# Patient Record
Sex: Male | Born: 1960 | Race: Black or African American | Hispanic: No | Marital: Single
Health system: Southern US, Community
[De-identification: ages and names within clinical notes are randomized; demographics above are authoritative.]

---

## 2004-06-04 ENCOUNTER — Emergency Department: Payer: Self-pay | Admitting: Unknown Physician Specialty

## 2004-06-04 ENCOUNTER — Other Ambulatory Visit: Payer: Self-pay

## 2005-06-07 ENCOUNTER — Emergency Department: Payer: Self-pay | Admitting: Emergency Medicine

## 2005-11-22 ENCOUNTER — Inpatient Hospital Stay: Payer: Self-pay | Admitting: Internal Medicine

## 2005-11-22 ENCOUNTER — Other Ambulatory Visit: Payer: Self-pay

## 2005-11-24 ENCOUNTER — Other Ambulatory Visit: Payer: Self-pay

## 2008-10-04 ENCOUNTER — Emergency Department: Payer: Self-pay | Admitting: Internal Medicine

## 2008-10-27 ENCOUNTER — Ambulatory Visit: Payer: Self-pay

## 2011-04-16 IMAGING — CT CT ABD-PELV W/ CM
1 of 4 series · 14 of 32 positions shown, 19 images · non-contrast
Comparison: There is

REASON FOR EXAM: abd pain
COMMENTS:

PROCEDURE:     CT  - CT ABDOMEN / PELVIS  W  - October 27, 2008  [DATE]
RESULT:     History: Abdominal pain
TECHNIQUE: Multiple axial images of the abdomen and pelvis were performed
from the lung bases to the pubic symphysis, with p.o. contrast and with 100
mL of Esovue-ATE intravenous contrast.

[Series 2: abdomen · axial · 0.79mm/px · z∈[-1080,-660]mm · 14 of 96 slices shown, 19 images]
[im 6/96  soft-tissue]
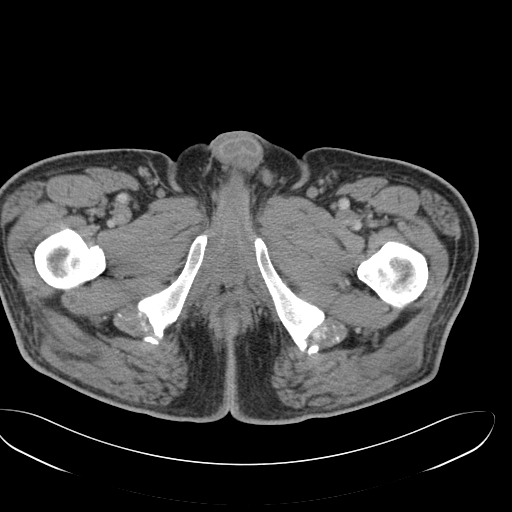
[im 6/96  bone]
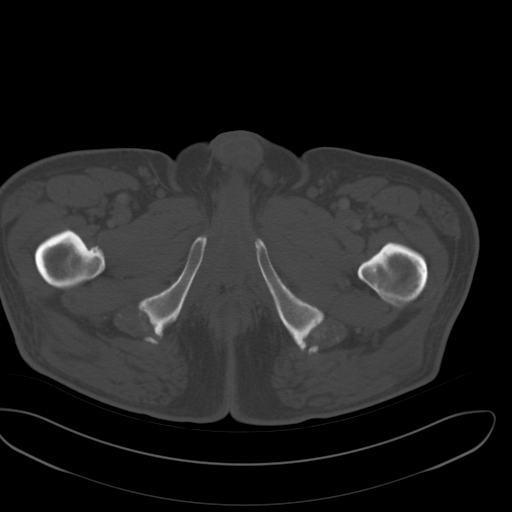
[im 12/96  soft-tissue]
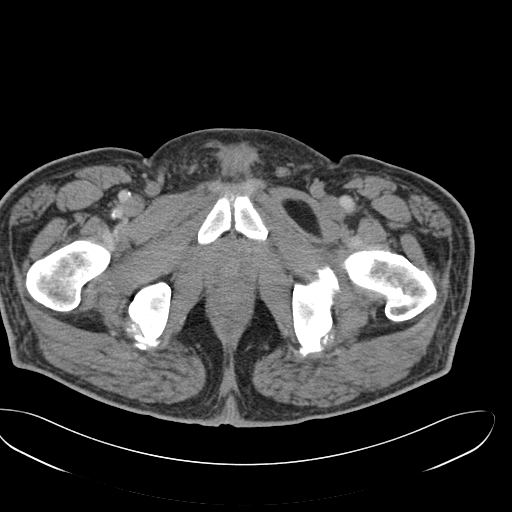
[im 23/96  soft-tissue]
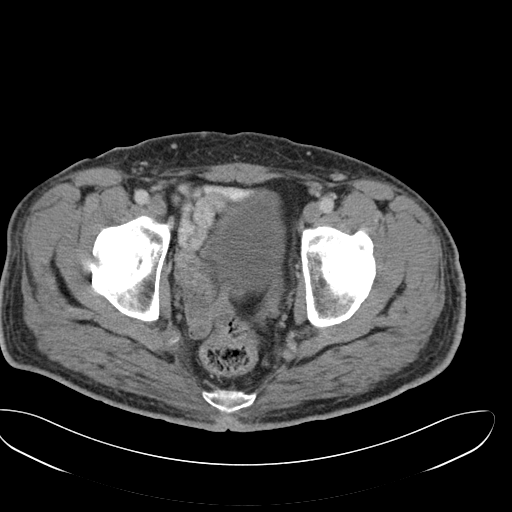
[im 28/96  soft-tissue]
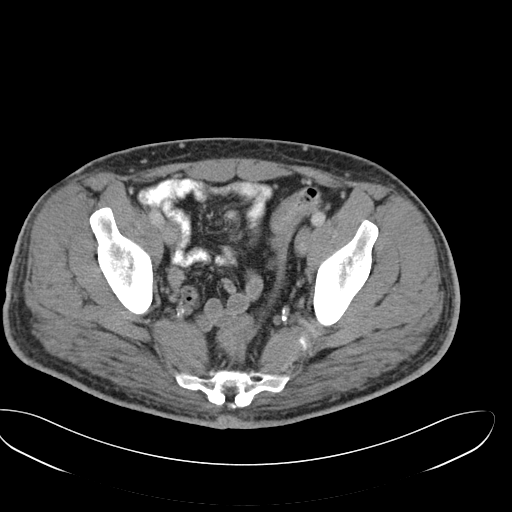
[im 34/96  soft-tissue]
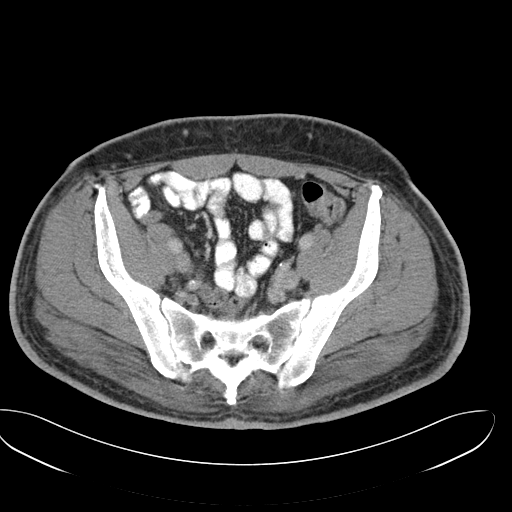
[im 40/96  soft-tissue]
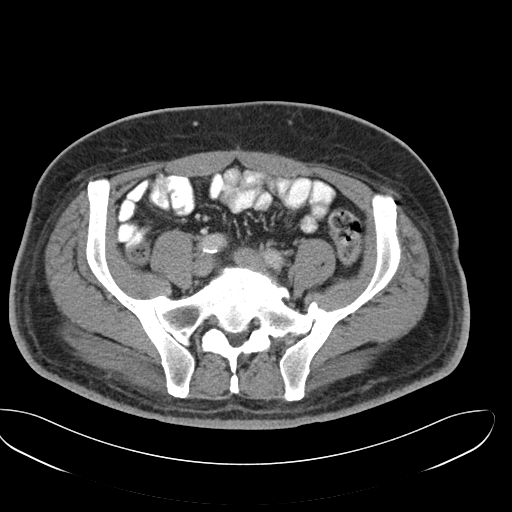
[im 51/96  soft-tissue]
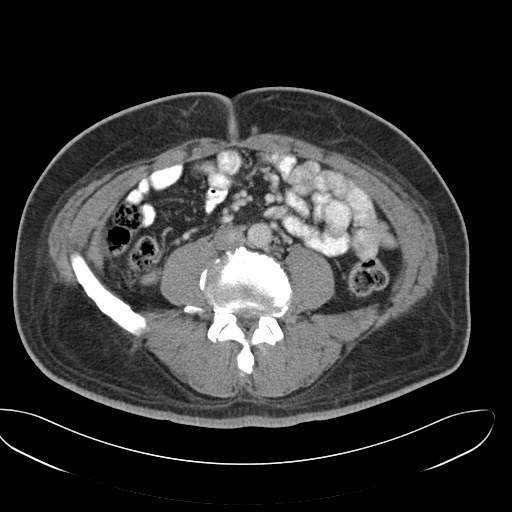
[im 56/96  soft-tissue]
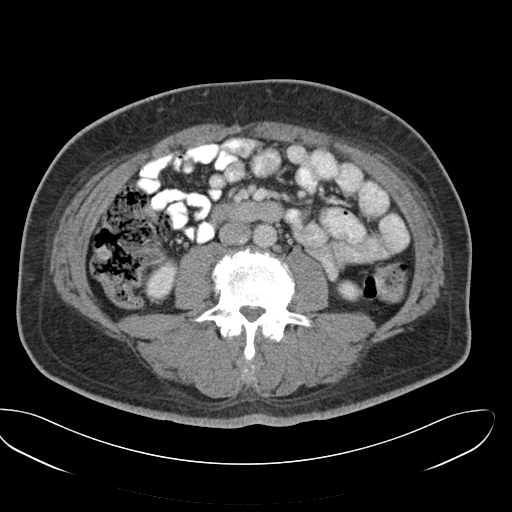
[im 62/96  soft-tissue]
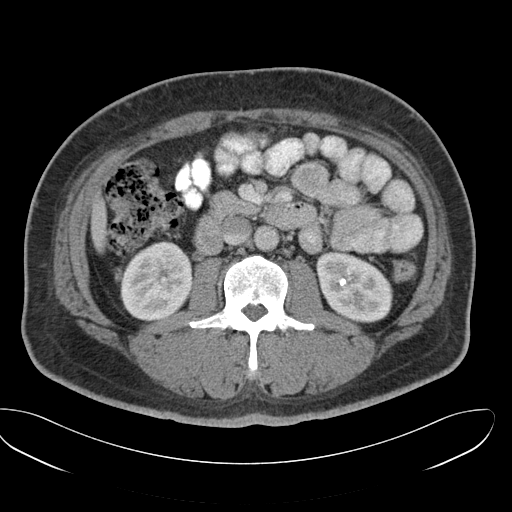
[im 62/96  bone]
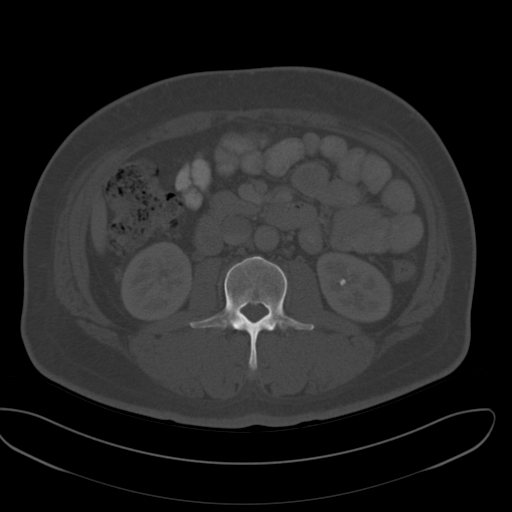
[im 68/96  soft-tissue]
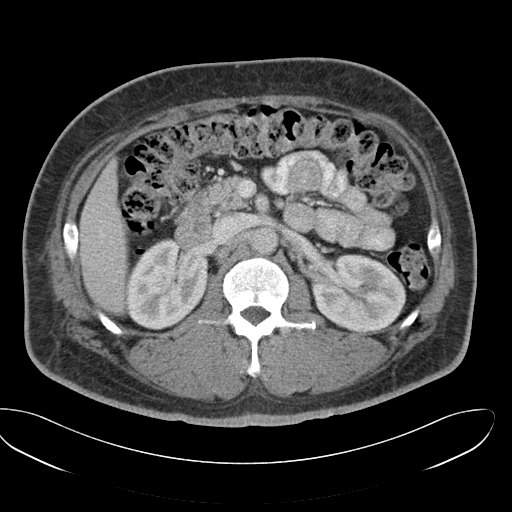
[im 73/96  soft-tissue]
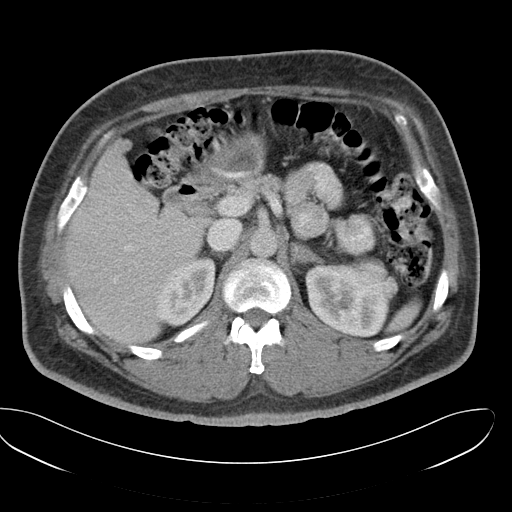
[im 73/96  lung]
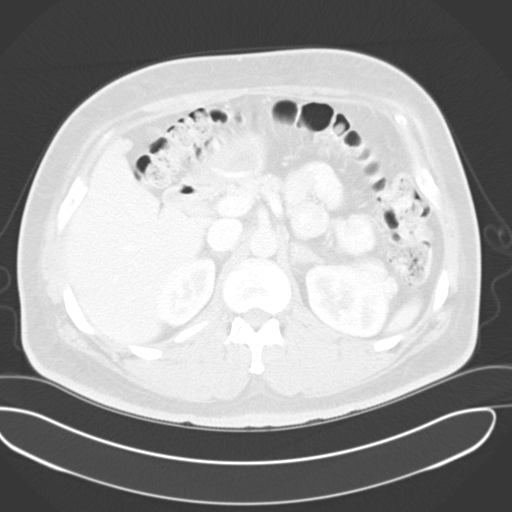
[im 79/96  lung]
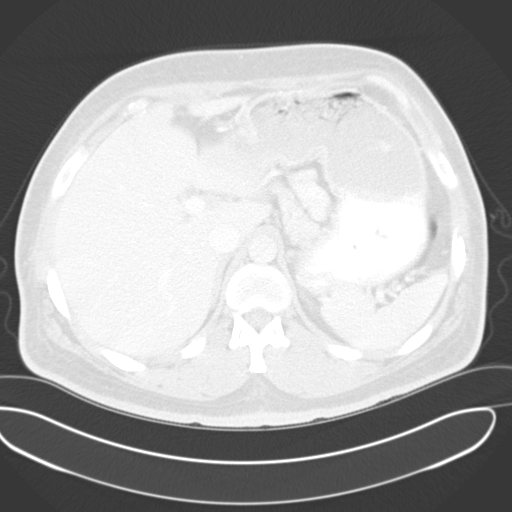
[im 84/96  soft-tissue]
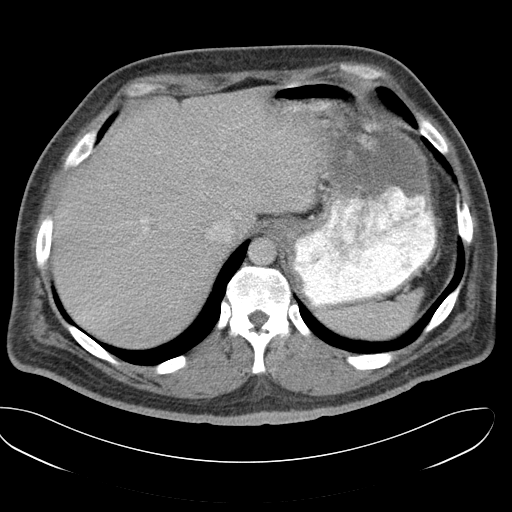
[im 84/96  lung]
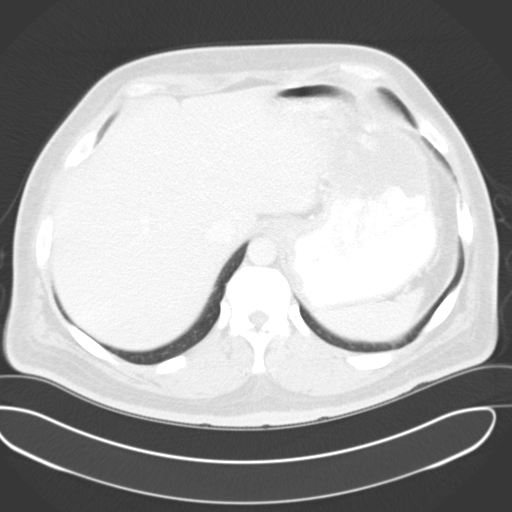
[im 90/96  soft-tissue]
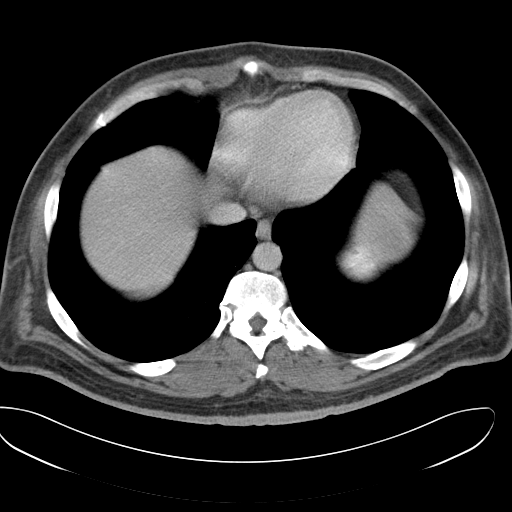
[im 90/96  lung]
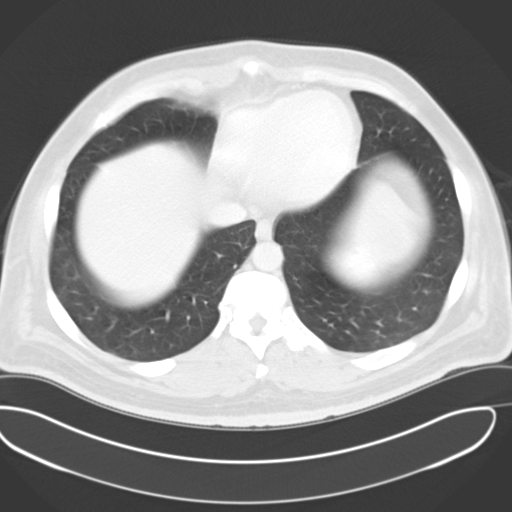

[14 of 32 positions shown; findings below may reference images not displayed]

FINDINGS: The lung bases are clear. There is no pneumothorax. The heart size is
normal.

The liver demonstrates no focal abnormality. There is no intrahepatic or
extrahepatic biliary ductal dilatation. The gallbladder is unremarkable. The
spleen demonstrates no focal abnormality. The adrenal glands, and pancreas
are normal. The bladder is unremarkable. There multiple nonobstructing
bilateral renal calculi. There is no ureteral calculus.

The stomach, duodenum, small intestine, and large intestine demonstrate no
contrast extravasation or dilatation. A normal caliber appendix is
visualized in the right lower quadrant. There is no pneumoperitoneum,
pneumatosis, or portal venous gas. There is no abdominal or pelvic free
fluid. There is no lymphadenopathy.

The abdominal aorta is normal in caliber.

There is lumbar spine spondylosis. There is severe degenerative disc disease
at L5-S1 with disc base narrowing and a broad-based disc osteophyte complex
impressing on the ventral thecal sac.
IMPRESSION: No acute abdominal or pelvic pathology.

Bilateral nonobstructing renal calculi.

## 2013-12-02 ENCOUNTER — Encounter: Payer: Self-pay | Admitting: Surgery

## 2013-12-11 ENCOUNTER — Encounter: Payer: Self-pay | Admitting: Surgery

## 2013-12-19 ENCOUNTER — Encounter: Payer: Self-pay | Admitting: General Surgery

## 2014-01-11 ENCOUNTER — Encounter: Payer: Self-pay | Admitting: Surgery

## 2014-01-11 ENCOUNTER — Encounter: Payer: Self-pay | Admitting: General Surgery

## 2017-02-12 ENCOUNTER — Emergency Department
Admission: EM | Admit: 2017-02-12 | Discharge: 2017-03-13 | Disposition: E | Payer: Medicare Other | Attending: Emergency Medicine | Admitting: Emergency Medicine

## 2017-02-12 DIAGNOSIS — I469 Cardiac arrest, cause unspecified: Secondary | ICD-10-CM | POA: Diagnosis not present

## 2017-02-12 LAB — GLUCOSE, CAPILLARY: GLUCOSE-CAPILLARY: 239 mg/dL — AB (ref 65–99)

## 2017-02-12 MED ORDER — EPINEPHRINE PF 1 MG/10ML IJ SOSY
PREFILLED_SYRINGE | INTRAMUSCULAR | Status: AC | PRN
  Administered 2017-02-12 (×2): 1 via INTRAVENOUS

## 2017-02-12 MED ORDER — SODIUM CHLORIDE 0.9 % IV SOLN
INTRAVENOUS | Status: AC | PRN
  Administered 2017-02-12: 1000 mL via INTRAVENOUS

## 2017-02-12 MED ORDER — ATROPINE SULFATE 1 MG/ML IJ SOLN
INTRAMUSCULAR | Status: AC | PRN
  Administered 2017-02-12: 1 mg via INTRAVENOUS

## 2017-02-12 NOTE — ED Notes (Signed)
House supervisor notified that pt is ready for transport to morgue

## 2017-02-12 NOTE — Code Documentation (Signed)
Time of death called by Dr Mayford KnifeWilliams

## 2017-02-12 NOTE — Code Documentation (Signed)
Pulse check- PEA per MD; no cardiac activity on [place]

## 2017-02-12 NOTE — ED Notes (Signed)
Pt's family has left; pt prepared for transfer to the morgue;

## 2017-02-12 NOTE — Code Documentation (Addendum)
Techs performing CPR switched; no cardiac activity noted on US per Dr Mayford KnifeWilliams

## 2017-02-12 NOTE — Code Documentation (Signed)
LUCAS removed per Dr Mayford KnifeWilliams; pulse check-PEA; manual compressions resumed;

## 2017-02-12 NOTE — Code Documentation (Signed)
Techs performing CPR changed

## 2017-02-12 NOTE — Progress Notes (Signed)
CPR in progress. ACLS protocols followed. King airway in place per ems. BBS auscultated per Dr Mayford KnifeWilliams. 100% o2 with bagging via king airway. Code ended per MD.

## 2017-02-12 NOTE — Code Documentation (Signed)
Family at beside. Family given emotional support by Irwin Brakemanawn Tulloch, RN and hospital chaplain

## 2017-02-12 NOTE — Progress Notes (Signed)
Chaplain received and responded to PG due to Staff providing CPR to patient and family on the way. CH offered silent prayer and ministry of spiritual presence. CH waited for family to arrive, post-death. Staff stated they would PG CH if needed once family arrives.     02/11/2017 2015  Clinical Encounter Type  Visited With Patient;Health care provider  Visit Type Initial;Spiritual support;ED  Referral From Nurse  Consult/Referral To Chaplain  Spiritual Encounters  Spiritual Needs Other (Comment)

## 2017-02-12 NOTE — ED Notes (Addendum)
Family still remains at bedside; they are trying to get in touch with pt's daughter; provided pt with drinks and snacks

## 2017-02-12 NOTE — ED Notes (Signed)
Multiple family members at bedside; chaplain remains; at family request Dr Mayford KnifeWilliams was just in the room speaking with family that has just arrived

## 2017-02-12 NOTE — Code Documentation (Signed)
Rhythm check by Dr Mayford KnifeWilliams, PEA; techs performing CPR switched

## 2017-02-12 NOTE — Progress Notes (Signed)
Chaplain received PG stating family is present. Visited with family as they sat in patient's room. Provided ministry of presence, words of comfort, and offered silent prayer.     02/18/2017 2040  Clinical Encounter Type  Visited With Patient;Patient and family together  Visit Type Follow-up;Spiritual support;Death  Referral From Nurse  Consult/Referral To Chaplain  Spiritual Encounters  Spiritual Needs Grief support

## 2017-02-12 NOTE — ED Provider Notes (Signed)
[facility]  Department of [facility] Medicine   [facility]   Chief Complaint: Cardiac arrest/unresponsive   Level V Caveat: Unresponsive  History of present illness: Patient was brought into the ER unresponsive in full arrest on the [place] device. Reportedly patient had a witnessed arrest at home by family. EMS arrived to find the patient unresponsive. He was intubated with a King airway and chest compressions was initiated. Patient had transient return of spontaneous circulation, had multiple episodes of ventricular fibrillation and he was defibrillator to 5 times. He was given 3 mg of epinephrine in succession again with only transient return of circulation.  ROS: Unable to obtain, Level V caveat  Scheduled Meds: Continuous Infusions: PRN Meds:. No past medical history on file. No past surgical history on file. Social History   Social History  . Marital status: Single    Spouse name: N/A  . Number of children: N/A  . Years of education: N/A   Occupational History  . Not on file.   Social History Main Topics  . Smoking status: Not on file  . Smokeless tobacco: Not on file  . Alcohol use Not on file  . Drug use: Unknown  . Sexual activity: Not on file   Other Topics Concern  . Not on file   Social History Narrative  . No narrative on file   Allergies not on file  Last set of Vital Signs (not current) There were no vitals filed for this visit.    Physical Exam  Gen: unresponsive Cardiovascular: pulseless But pulses are present via the [facility] device Resp: apneic. Breath sounds equal bilaterally with a King airway Abd: nondistended  Neuro: GCS 3, unresponsive to pain  HEENT: No blood in posterior pharynx, gag reflex absent  Neck: No crepitus  Musculoskeletal: No deformity  Skin: warm  Cardiopulmonary Resuscitation (CPR) Procedure Note  Directed/Performed by: Emily FilbertWilliams, Timberlynn Kizziah E I personally directed ancillary staff and/or performed CPR in an  effort to regain return of spontaneous circulation and to maintain cardiac, neuro and systemic perfusion.    Medical Decision making  Patient present to the ER status post cardiac arrest of uncertain etiology, likely MI.   Assessment and Plan  Cardiopulmonary arrest   We continued resuscitative measures unsuccessfully. He had good pulses with CPR via the [place] device as well as with manual compressions. We gave additional doses of epinephrine as well as atropine and bicarbonate. Patient never had return of spontaneous circulation, remained in a PA rhythm. Bedside ultrasound confirmed absent cardiac activity. Time of death was 2020   Emily FilbertWilliams, Carey Lafon E, MD 05/20/17 2026

## 2017-02-12 NOTE — Code Documentation (Signed)
Techs performing CPR switched

## 2017-03-13 DEATH — deceased
